# Patient Record
Sex: Male | Born: 1985 | Race: Asian | Hispanic: No | Marital: Married | Smoking: Former smoker
Health system: Southern US, Community
[De-identification: ages and names within clinical notes are randomized; demographics above are authoritative.]

---

## 2021-07-31 ENCOUNTER — Emergency Department (HOSPITAL_COMMUNITY)
Admission: EM | Admit: 2021-07-31 | Discharge: 2021-07-31 | Disposition: A | Discharge status: Home / Self Care | Payer: Self-pay | Attending: Emergency Medicine | Admitting: Emergency Medicine

## 2021-07-31 ENCOUNTER — Encounter (HOSPITAL_COMMUNITY): Payer: Self-pay

## 2021-07-31 ENCOUNTER — Emergency Department (HOSPITAL_COMMUNITY): Payer: Self-pay

## 2021-07-31 DIAGNOSIS — N200 Calculus of kidney: Secondary | ICD-10-CM | POA: Insufficient documentation

## 2021-07-31 DIAGNOSIS — Z87891 Personal history of nicotine dependence: Secondary | ICD-10-CM | POA: Insufficient documentation

## 2021-07-31 LAB — CBC WITH DIFFERENTIAL/PLATELET
Abs Immature Granulocytes: 0.02 10*3/uL (ref 0.00–0.07)
Basophils Absolute: 0.1 10*3/uL (ref 0.0–0.1)
Basophils Relative: 1 %
Eosinophils Absolute: 0.1 10*3/uL (ref 0.0–0.5)
Eosinophils Relative: 1 %
HCT: 47.3 % (ref 39.0–52.0)
Hemoglobin: 16.3 g/dL (ref 13.0–17.0)
Immature Granulocytes: 0 %
Lymphocytes Relative: 30 %
Lymphs Abs: 2.3 10*3/uL (ref 0.7–4.0)
MCH: 31.2 pg (ref 26.0–34.0)
MCHC: 34.5 g/dL (ref 30.0–36.0)
MCV: 90.4 fL (ref 80.0–100.0)
Monocytes Absolute: 0.5 10*3/uL (ref 0.1–1.0)
Monocytes Relative: 7 %
Neutro Abs: 4.6 10*3/uL (ref 1.7–7.7)
Neutrophils Relative %: 61 %
Platelets: 219 10*3/uL (ref 150–400)
RBC: 5.23 MIL/uL (ref 4.22–5.81)
RDW: 12.6 % (ref 11.5–15.5)
WBC: 7.5 10*3/uL (ref 4.0–10.5)
nRBC: 0 % (ref 0.0–0.2)

## 2021-07-31 LAB — COMPREHENSIVE METABOLIC PANEL
ALT: 52 U/L — ABNORMAL HIGH (ref 0–44)
AST: 29 U/L (ref 15–41)
Albumin: 4.7 g/dL (ref 3.5–5.0)
Alkaline Phosphatase: 65 U/L (ref 38–126)
Anion gap: 11 (ref 5–15)
BUN: 13 mg/dL (ref 6–20)
CO2: 22 mmol/L (ref 22–32)
Calcium: 9.4 mg/dL (ref 8.9–10.3)
Chloride: 104 mmol/L (ref 98–111)
Creatinine, Ser: 0.98 mg/dL (ref 0.61–1.24)
GFR, Estimated: >60 mL/min (ref >60)
Glucose, Bld: 125 mg/dL — ABNORMAL HIGH (ref 70–99)
Potassium: 3.3 mmol/L — ABNORMAL LOW (ref 3.5–5.1)
Sodium: 137 mmol/L (ref 135–145)
Total Bilirubin: 0.8 mg/dL (ref 0.3–1.2)
Total Protein: 8.2 g/dL — ABNORMAL HIGH (ref 6.5–8.1)

## 2021-07-31 LAB — URINALYSIS, ROUTINE W REFLEX MICROSCOPIC
Bacteria, UA: NONE SEEN
Bilirubin Urine: NEGATIVE
Glucose, UA: NEGATIVE mg/dL
Ketones, ur: NEGATIVE mg/dL
Leukocytes,Ua: NEGATIVE
Nitrite: NEGATIVE
Protein, ur: NEGATIVE mg/dL
Specific Gravity, Urine: 1.003 — ABNORMAL LOW (ref 1.005–1.030)
pH: 8 (ref 5.0–8.0)

## 2021-07-31 MED ORDER — HYDROCODONE-ACETAMINOPHEN 5-325 MG PO TABS: 1.0000 | ORAL_TABLET | Freq: Four times a day (QID) | ORAL | 0 refills | Status: AC | PRN

## 2021-07-31 MED ORDER — TAMSULOSIN HCL 0.4 MG PO CAPS: 0.4000 mg | ORAL_CAPSULE | Freq: Every day | ORAL | 0 refills | Status: AC

## 2021-07-31 MED ORDER — HYDROCODONE-ACETAMINOPHEN 5-325 MG PO TABS
1.0000 | ORAL_TABLET | Freq: Once | ORAL | Status: AC
  Administered 2021-07-31: 1 via ORAL
  Filled 2021-07-31: qty 1

## 2021-07-31 NOTE — ED Provider Notes (Signed)
Emergency Medicine Provider Triage Evaluation Note  Peter [name] , a 35 y.o. male  was evaluated in triage.  Pt complains of sudden onset left flank pain.  Radiates onto his left side.  No chest pain, shortness breath, traumatic injury.  No history of IV drug use, fever.  No radicular symptoms.  Does admit to decreased nation feeling like he cannot empty his bladder.  He is unsure if any hematuria.  No history of stones  Review of Systems  Positive: Difficulty urination, left flank pain Negative: Fever, emesis, numbness, weakness  Physical Exam  BP (!) 146/104 (BP Location: Right Arm)   Pulse 75   Temp 98 F (36.7 C) (Oral)   Resp (!) 22   SpO2 98%  Gen:   Awake, standing hunched over chair. Resp:  Normal effort  MSK:   Moves extremities without difficulty, no midline spinal tenderness Other:  Points to the left flank as cause of his symptoms  Medical Decision Making  Medically screening exam initiated at 9:53 AM.  Appropriate orders placed.  Peter [name] was informed that the remainder of the evaluation will be completed by another provider, this initial triage assessment does not replace that evaluation, and the importance of remaining in the ED until their evaluation is complete.  Left flank pain, difficulty urinating   Peter [name] A, PA-C 07/31/21 0954    Peter Logan, DO 07/31/21 1437

## 2021-07-31 NOTE — Discharge Instructions (Addendum)
You have a kidney stone today.  I have sent a medication to your pharmacy.  The tamsulosin will help pass the kidney stone, the hydrocodone-acetaminophen is for your pain.  Please take it as prescribed.  No that this is a controlled substance so please keep it out of the reach of others.  Do not drive on this medication.  You should be able to pass your kidney stone on your own because it is a small one.  But in the event that you are still having pain in the next 5 to 7 days please follow-up with the office attached to these discharge papers.  It was a pleasure to meet you and I hope that you feel better.

## 2021-07-31 NOTE — ED Triage Notes (Signed)
Pt arrived via POV, c/o sudden onset right sided back pain. Denies any trauma to area. No recent injury.

## 2021-07-31 NOTE — ED Provider Notes (Signed)
[name]-EMERGENCY DEPT Provider Note   CSN: [id number] Arrival date & time: 07/31/21  0913     History Chief Complaint  Patient presents with   Back Pain    Peter [name] is [name] 35 y.o. male with no pertinent past medical history presenting today with [name] complaint of left flank pain.  Reports that this has been going on for the past couple days however this morning when he was trying to urinate and felt very difficult and painful.  No history of kidney stones.  No hematuria but endorses dysuria.  No fevers or chills.   Back Pain Associated symptoms: dysuria   Associated symptoms: no abdominal pain and no fever       History reviewed. No pertinent past medical history.  There are no problems to display for this patient.   History reviewed. No pertinent surgical history.     History reviewed. No pertinent family history.  Social History   Tobacco Use   Smoking status: Former    Types: Cigarettes   Smokeless tobacco: Former    Home Medications Prior to Admission medications   Medication Sig Start Date End Date Taking? Authorizing Provider  HYDROcodone-acetaminophen (NORCO/VICODIN) 5-325 MG tablet Take 1 tablet by mouth every 6 (six) hours as needed for up to 6 days. 07/31/21 08/06/21 Yes Peter [name], Peter [name]  tamsulosin (FLOMAX) 0.4 MG CAPS capsule Take 1 capsule (0.4 mg total) by mouth daily for 5 days. 07/31/21 08/05/21 Yes Peter [name] [name], Peter [name]    Allergies    Patient has no known allergies.  Review of Systems   Review of Systems  Constitutional:  Negative for chills and fever.  Gastrointestinal:  Negative for abdominal pain, nausea and vomiting.  Genitourinary:  Positive for dysuria. Negative for hematuria.  Musculoskeletal:  Positive for back pain.  All other systems reviewed and are negative.  Physical Exam Updated Vital Signs BP (!) 142/84   Pulse 69   Temp 98 F (36.7 C) (Oral)   Resp 18   SpO2 99%   Physical Exam Vitals  and nursing note reviewed.  Constitutional:      Appearance: Normal appearance.  HENT:     Head: Normocephalic and atraumatic.  Eyes:     General: No scleral icterus.    Conjunctiva/sclera: Conjunctivae normal.  Pulmonary:     Effort: Pulmonary effort is normal. No respiratory distress.  Abdominal:     General: Abdomen is flat.     Palpations: Abdomen is soft.     Tenderness: There is no right CVA tenderness or left CVA tenderness.  Musculoskeletal:        General: No tenderness.  Skin:    General: Skin is warm and dry.     Findings: No rash.  Neurological:     Mental Status: He is alert.  Psychiatric:        Mood and Affect: Mood normal.        Behavior: Behavior normal.    ED Results / Procedures / Treatments   Labs (all labs ordered are listed, but only abnormal results are displayed) Labs Reviewed  COMPREHENSIVE METABOLIC PANEL - Abnormal; Notable for the following components:      Result Value   Potassium 3.3 (*)    Glucose, Bld 125 (*)    Total Protein 8.2 (*)    ALT 52 (*)    All other components within normal limits  URINALYSIS, ROUTINE W REFLEX MICROSCOPIC - Abnormal; Notable for the following components:  Color, Urine STRAW (*)    Specific Gravity, Urine 1.003 (*)    Hgb urine dipstick SMALL (*)    All other components within normal limits  CBC WITH DIFFERENTIAL/PLATELET    EKG None  Radiology CT Renal Stone Study  Result Date: 07/31/2021 CLINICAL DATA:  Sudden onset left flank pain EXAM: CT ABDOMEN AND PELVIS WITHOUT CONTRAST TECHNIQUE: Multidetector CT imaging of the abdomen and pelvis was performed following the standard protocol without IV contrast. COMPARISON:  None. FINDINGS: Lower chest: No acute abnormality. Hepatobiliary: No solid liver abnormality is seen. No gallstones, gallbladder wall thickening, or biliary dilatation. Pancreas: Unremarkable. No pancreatic ductal dilatation or surrounding inflammatory changes. Spleen: Normal in size without  significant abnormality. Adrenals/Urinary Tract: Adrenal glands are unremarkable. There is [name] 2-3 mm calculus at the left ureterovesicular junction with mild associated left hydronephrosis and hydroureter (series 2, image 80). Additional small nonobstructive calculus of the inferior pole of the left kidney. No right-sided calculi. Bladder is unremarkable. Stomach/Bowel: Stomach is within normal limits. Appendix appears normal. No evidence of bowel wall thickening, distention, or inflammatory changes. Vascular/Lymphatic: No significant vascular findings are present. No enlarged abdominal or pelvic lymph nodes. Reproductive: No mass or other significant abnormality. Other: No abdominal wall hernia or abnormality. No abdominopelvic ascites. Musculoskeletal: No acute or significant osseous findings. IMPRESSION: 1. There is [name] 2-3 mm calculus at the left ureterovesicular junction with mild associated left hydronephrosis and hydroureter. 2. Additional small nonobstructive calculus of the inferior pole of the left kidney. Electronically Signed   By: Jearld Lesch M.D.   On: 07/31/2021 11:16    Procedures Procedures   Medications Ordered in ED Medications  HYDROcodone-acetaminophen (NORCO/VICODIN) 5-325 MG per tablet 1 tablet (1 tablet Oral Given 07/31/21 1002)    ED Course  I have reviewed the triage vital signs and the nursing notes.  Pertinent labs & imaging results that were available during my care of the patient were reviewed by me and considered in my medical decision making (see chart for details).    MDM Rules/Calculators/[name]&P Patient was evaluated by me.  He was in no acute distress.  He was offered an interpreter due to [name] potential language barrier however he reported that he did not need 1 at this time.  His wife would be able to help translate his discharge papers.  He did not have any fevers, chills and was hemodynamically stable.  Low suspicion for pyelonephritis.  CT scan revealed [name] 2 to 3 mm  stone in the left UVJ and [name] small associated hydronephrosis.  Due to the size of his stone I believe he is likely to pass this on his own.  Has been given Flomax and Vicodin for the next few days while he attempts to pass the stone.  Also given [name] office to follow-up with if he continues to have pain over the next few days.  Patient agreeable to this plan, ambulatory and stable for discharge at this time.   Final Clinical Impression(s) / ED Diagnoses Final diagnoses:  Renal calculi    Rx / DC Orders ED Discharge Orders          Ordered    tamsulosin (FLOMAX) 0.4 MG CAPS capsule  Daily        07/31/21 1527    HYDROcodone-acetaminophen (NORCO/VICODIN) 5-325 MG tablet  Every 6 hours PRN        07/31/21 1527             Roshard Rezabek [name], Peter [name] 07/31/21  I[place], [place], DO 08/02/21 [phone]

## 2022-10-19 IMAGING — CT CT RENAL STONE PROTOCOL
2 of 4 series · 16 of 46 positions shown, 18 images · non-contrast
Comparison: None.

CLINICAL DATA: Sudden onset left flank pain

EXAM:
CT ABDOMEN AND PELVIS WITHOUT CONTRAST
TECHNIQUE: Multidetector CT imaging of the abdomen and pelvis was performed
following the standard protocol without IV contrast.

[Series 2: axial st · axial · 0.77mm/px · z∈[-578,-148]mm · 13 of 100 slices shown, 15 images]
[im 7/100  soft-tissue]
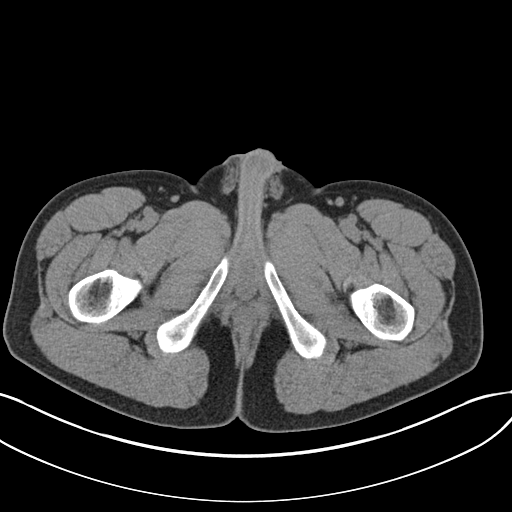
[im 7/100  bone]
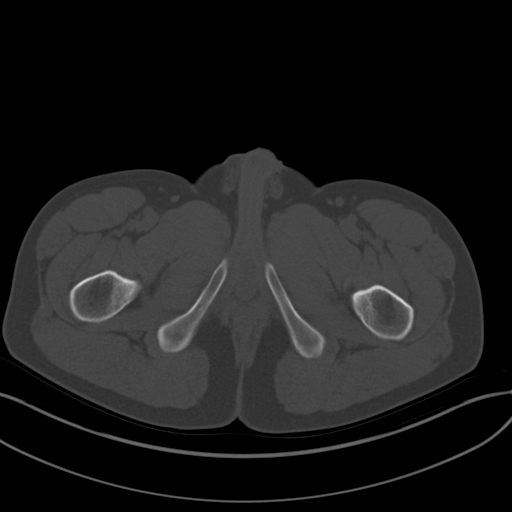
[im 13/100  soft-tissue]
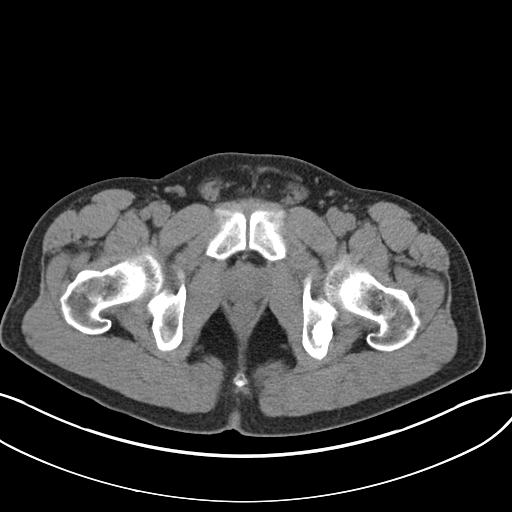
[im 19/100  soft-tissue]
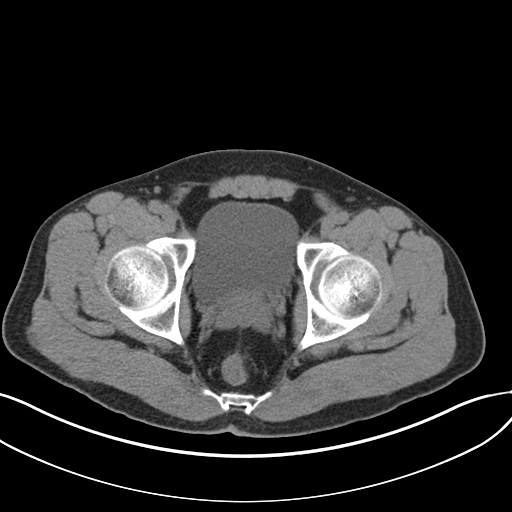
[im 31/100  soft-tissue]
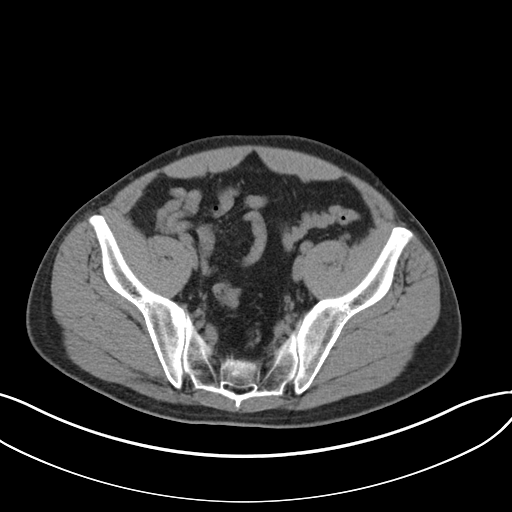
[im 38/100  soft-tissue]
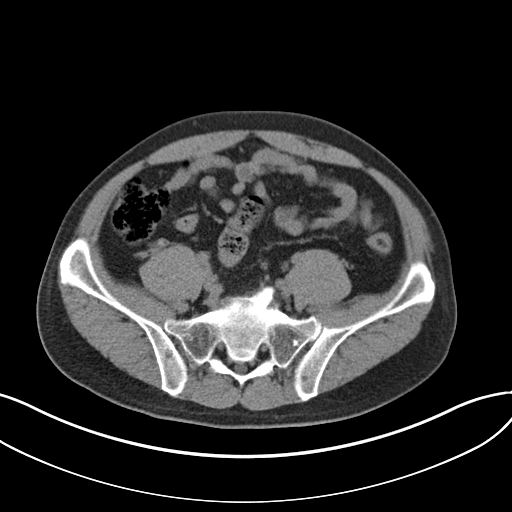
[im 44/100  soft-tissue]
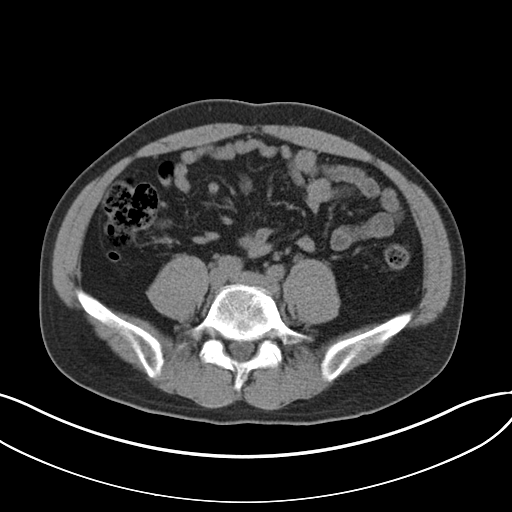
[im 50/100  soft-tissue]
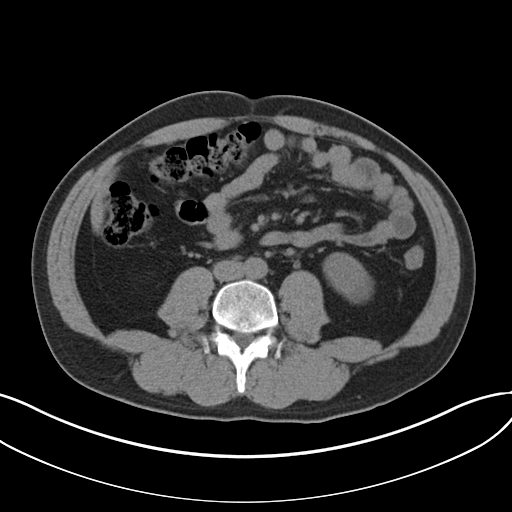
[im 56/100  soft-tissue]
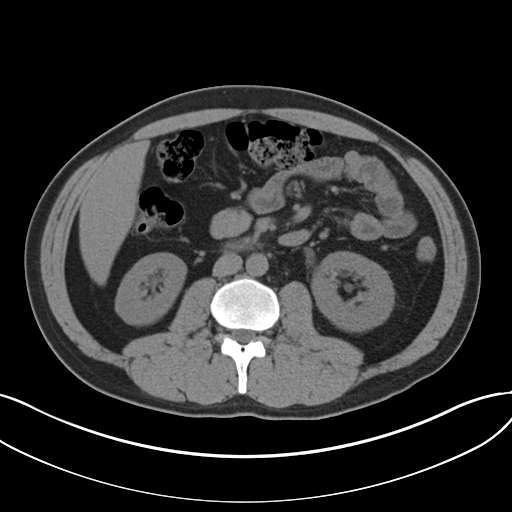
[im 62/100  soft-tissue]
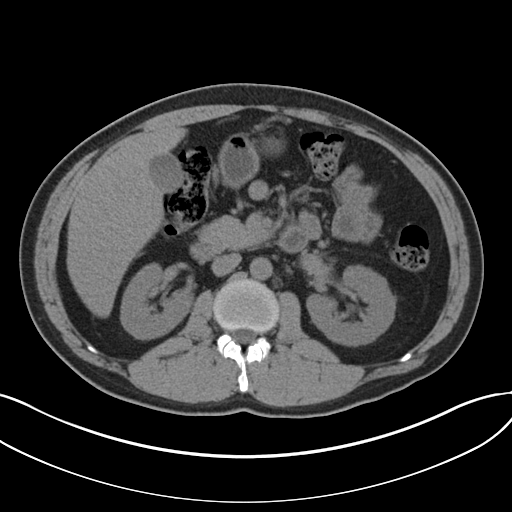
[im 62/100  bone]
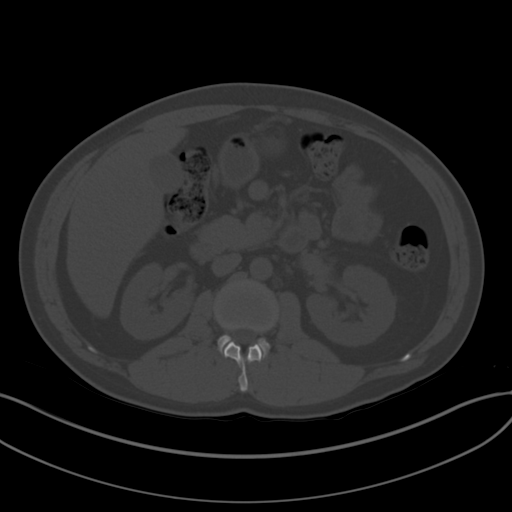
[im 69/100  soft-tissue]
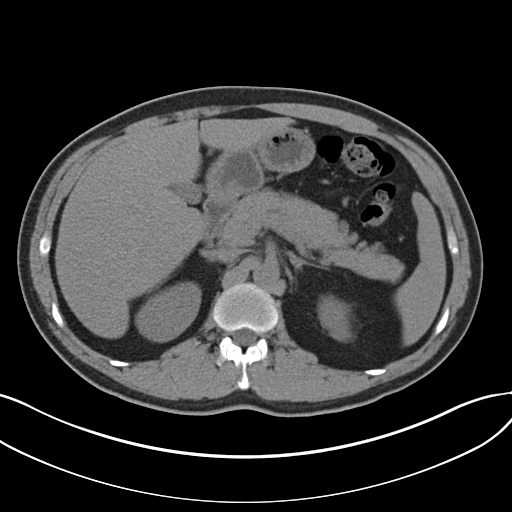
[im 81/100  soft-tissue]
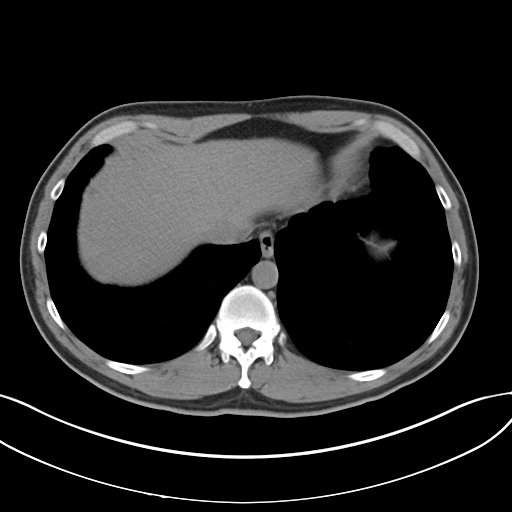
[im 87/100  soft-tissue]
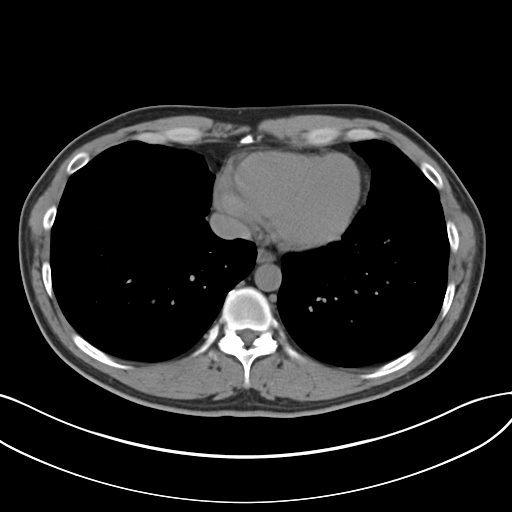
[im 93/100  soft-tissue]
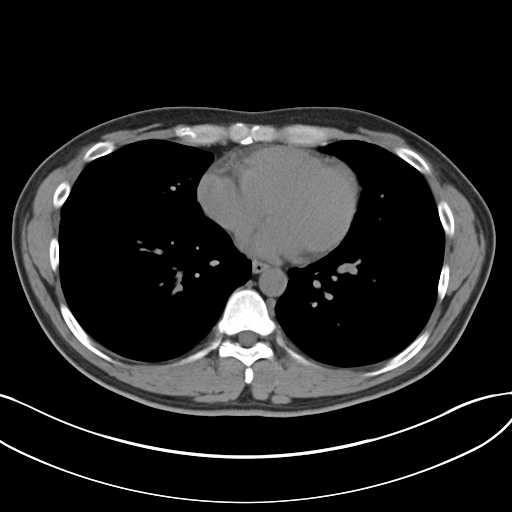

[Series 5: coronal · coronal · 0.89mm/px · 3 of 122 slices shown]
[im 41/122  soft-tissue]
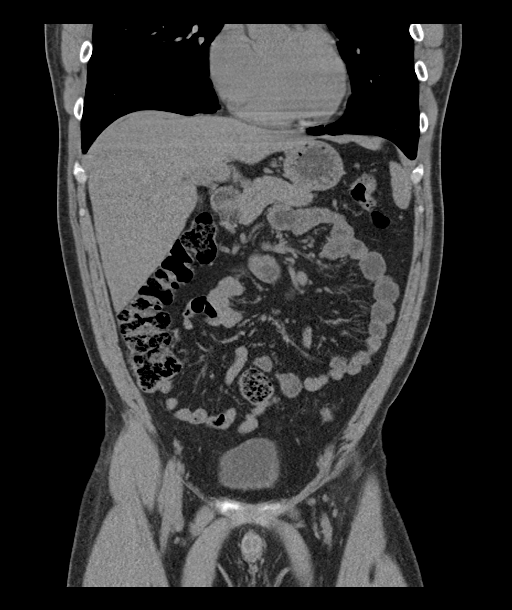
[im 54/122  soft-tissue]
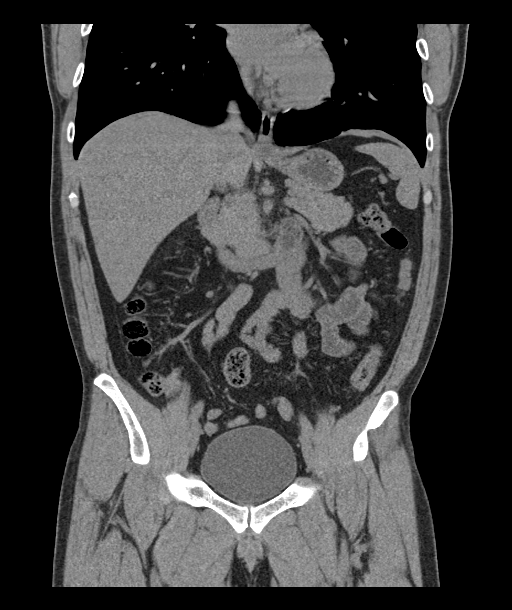
[im 68/122  soft-tissue]
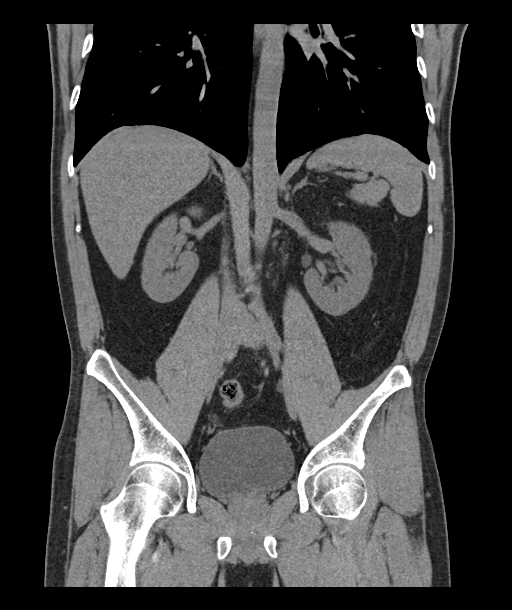

[16 of 46 positions shown; findings below may reference images not displayed]

FINDINGS: Lower chest: No acute abnormality.

Hepatobiliary: No solid liver abnormality is seen. No gallstones,
gallbladder wall thickening, or biliary dilatation.

Pancreas: Unremarkable. No pancreatic ductal dilatation or
surrounding inflammatory changes.

Spleen: Normal in size without significant abnormality.

Adrenals/Urinary Tract: Adrenal glands are unremarkable. There is a
2-3 mm calculus at the left ureterovesicular junction with mild
associated left hydronephrosis and hydroureter (series 2, image 80).
Additional small nonobstructive calculus of the inferior pole of the
left kidney. No right-sided calculi. Bladder is unremarkable.

Stomach/Bowel: Stomach is within normal limits. Appendix appears
normal. No evidence of bowel wall thickening, distention, or
inflammatory changes.

Vascular/Lymphatic: No significant vascular findings are present. No
enlarged abdominal or pelvic lymph nodes.

Reproductive: No mass or other significant abnormality.

Other: No abdominal wall hernia or abnormality. No abdominopelvic
ascites.

Musculoskeletal: No acute or significant osseous findings.
IMPRESSION: 1. There is a 2-3 mm calculus at the left ureterovesicular junction
with mild associated left hydronephrosis and hydroureter.
2. Additional small nonobstructive calculus of the inferior pole of
the left kidney.
